# Patient Record
Sex: Female | Born: 1980 | Hispanic: No | Marital: Single
Health system: Southern US, Community
[De-identification: ages and names within clinical notes are randomized; demographics above are authoritative.]

---

## 2001-12-19 ENCOUNTER — Inpatient Hospital Stay (HOSPITAL_COMMUNITY): Admission: EM | Admit: 2001-12-19 | Discharge: 2001-12-22 | Payer: Self-pay | Admitting: Psychiatry

## 2002-02-12 ENCOUNTER — Encounter: Admission: RE | Admit: 2002-02-12 | Discharge: 2002-02-12 | Payer: Self-pay | Admitting: *Deleted

## 2005-08-29 ENCOUNTER — Emergency Department (HOSPITAL_COMMUNITY): Admission: EM | Admit: 2005-08-29 | Discharge: 2005-08-29 | Payer: Self-pay | Admitting: Emergency Medicine

## 2005-09-01 ENCOUNTER — Inpatient Hospital Stay (HOSPITAL_COMMUNITY): Admission: AD | Admit: 2005-09-01 | Discharge: 2005-09-01 | Payer: Self-pay | Admitting: Family Medicine

## 2005-10-06 ENCOUNTER — Emergency Department (HOSPITAL_COMMUNITY): Admission: EM | Admit: 2005-10-06 | Discharge: 2005-10-06 | Payer: Self-pay | Admitting: Emergency Medicine

## 2005-10-10 ENCOUNTER — Inpatient Hospital Stay (HOSPITAL_COMMUNITY): Admission: AD | Admit: 2005-10-10 | Discharge: 2005-10-10 | Payer: Self-pay | Admitting: Family Medicine

## 2005-11-03 ENCOUNTER — Inpatient Hospital Stay (HOSPITAL_COMMUNITY): Admission: AD | Admit: 2005-11-03 | Discharge: 2005-11-03 | Payer: Self-pay | Admitting: Obstetrics & Gynecology

## 2005-11-10 ENCOUNTER — Ambulatory Visit (HOSPITAL_COMMUNITY): Admission: RE | Admit: 2005-11-10 | Discharge: 2005-11-10 | Payer: Self-pay | Admitting: Family Medicine

## 2005-11-27 ENCOUNTER — Inpatient Hospital Stay (HOSPITAL_COMMUNITY): Admission: AD | Admit: 2005-11-27 | Discharge: 2005-11-27 | Payer: Self-pay | Admitting: Otolaryngology

## 2006-01-14 ENCOUNTER — Inpatient Hospital Stay (HOSPITAL_COMMUNITY): Admission: AD | Admit: 2006-01-14 | Discharge: 2006-01-14 | Payer: Self-pay | Admitting: Obstetrics and Gynecology

## 2006-04-02 ENCOUNTER — Inpatient Hospital Stay (HOSPITAL_COMMUNITY): Admission: AD | Admit: 2006-04-02 | Discharge: 2006-04-02 | Payer: Self-pay | Admitting: Obstetrics and Gynecology

## 2006-04-12 ENCOUNTER — Inpatient Hospital Stay (HOSPITAL_COMMUNITY): Admission: AD | Admit: 2006-04-12 | Discharge: 2006-04-12 | Payer: Self-pay | Admitting: Obstetrics and Gynecology

## 2006-04-13 ENCOUNTER — Inpatient Hospital Stay (HOSPITAL_COMMUNITY): Admission: AD | Admit: 2006-04-13 | Discharge: 2006-04-15 | Payer: Self-pay | Admitting: Obstetrics and Gynecology

## 2006-04-13 ENCOUNTER — Inpatient Hospital Stay (HOSPITAL_COMMUNITY): Admission: AD | Admit: 2006-04-13 | Discharge: 2006-04-13 | Payer: Self-pay | Admitting: Obstetrics and Gynecology

## 2006-05-22 ENCOUNTER — Other Ambulatory Visit: Admission: RE | Admit: 2006-05-22 | Discharge: 2006-05-22 | Payer: Self-pay | Admitting: Obstetrics and Gynecology

## 2007-07-19 IMAGING — CT CT HEAD W/O CM
1 series · 16 of 30 positions shown, 20 images · IV contrast (agent unspecified)
Comparison: None.

CLINICAL DATA: Headache. 
 HEAD CT WITHOUT CONTRAST:
TECHNIQUE: Contiguous axial images were obtained from the base of the skull through the vertex according to standard protocol without contrast.

[Series 2: head_seq 4.5 h45s st · axial · 0.43mm/px · z∈[-157,-31]mm · 16 of 32 slices shown, 20 images]
[im 2/32  brain]
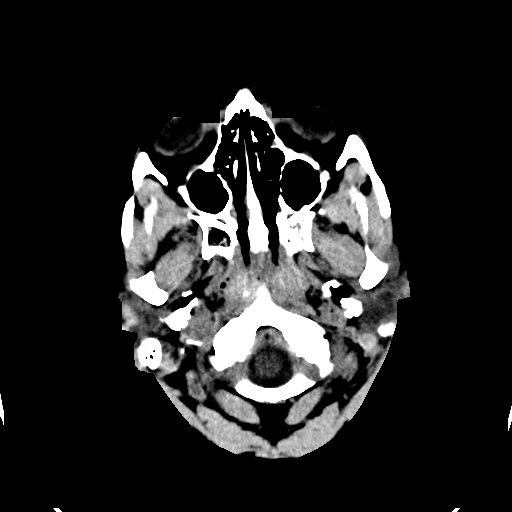
[im 2/32  bone]
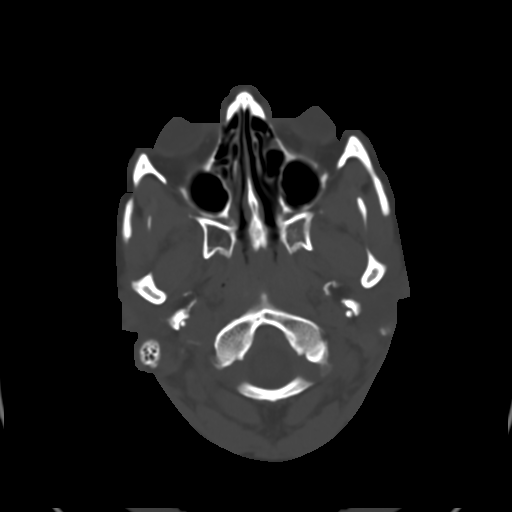
[im 4/32  brain]
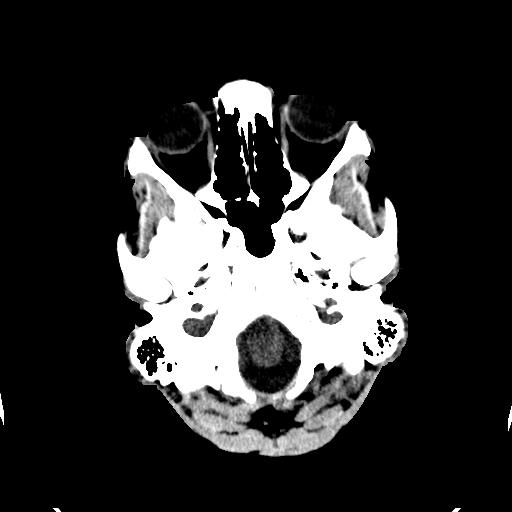
[im 6/32  brain]
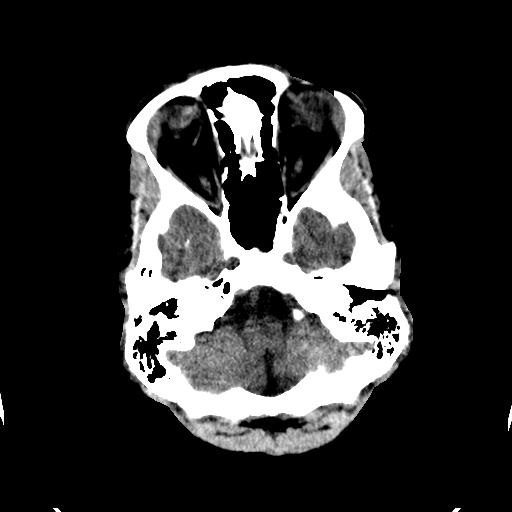
[im 8/32  brain]
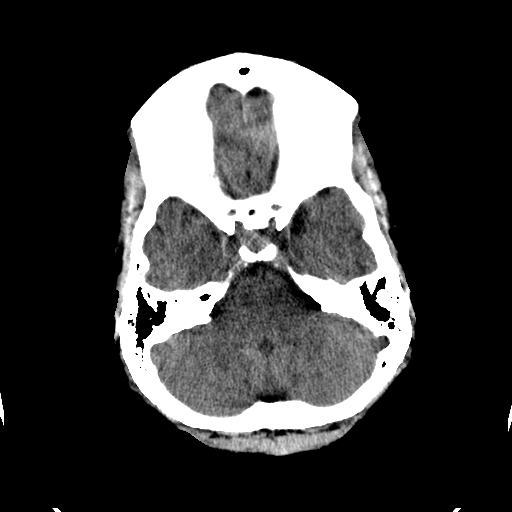
[im 9/32  brain]
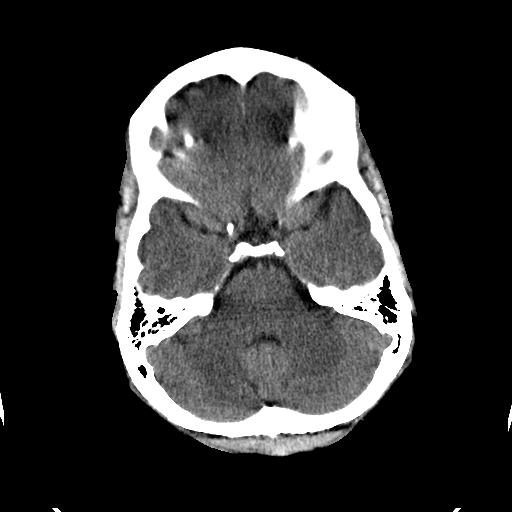
[im 9/32  bone]
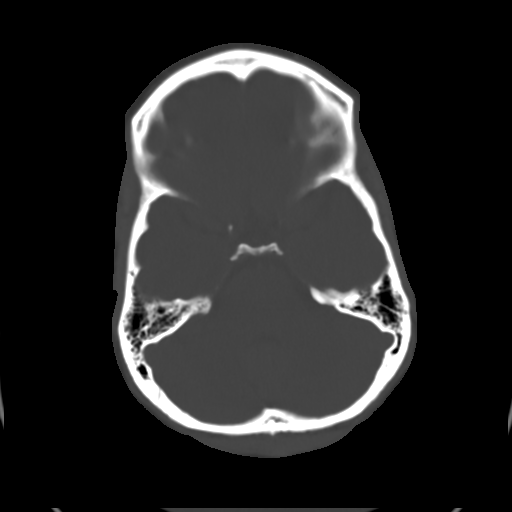
[im 11/32  brain]
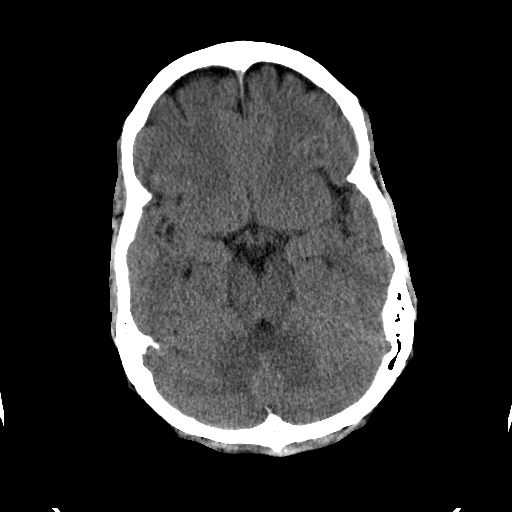
[im 13/32  brain]
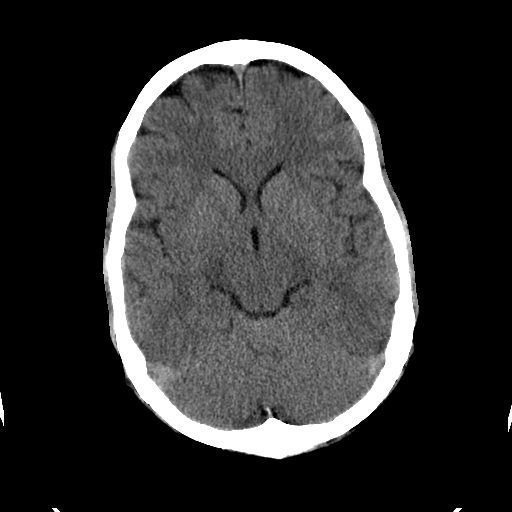
[im 15/32  brain]
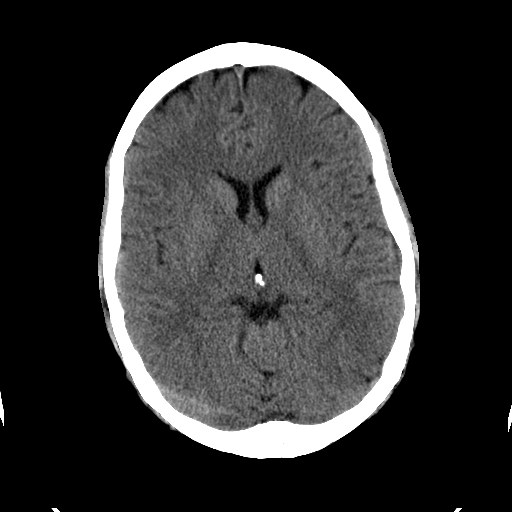
[im 17/32  brain]
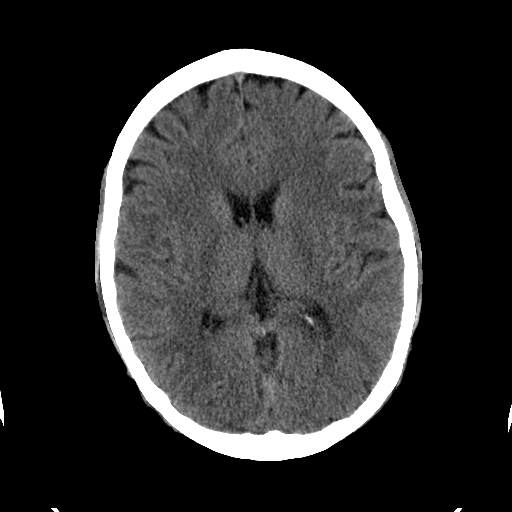
[im 17/32  bone]
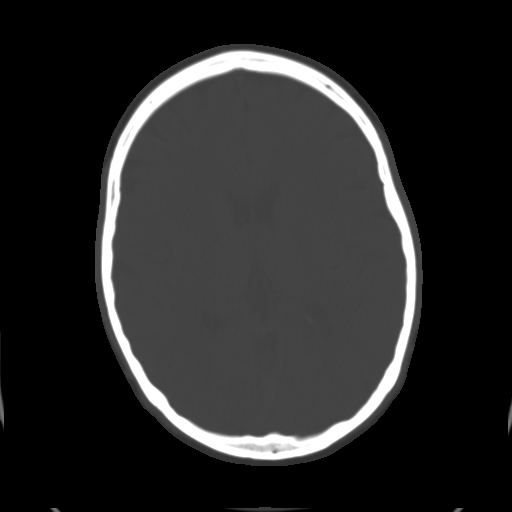
[im 19/32  brain]
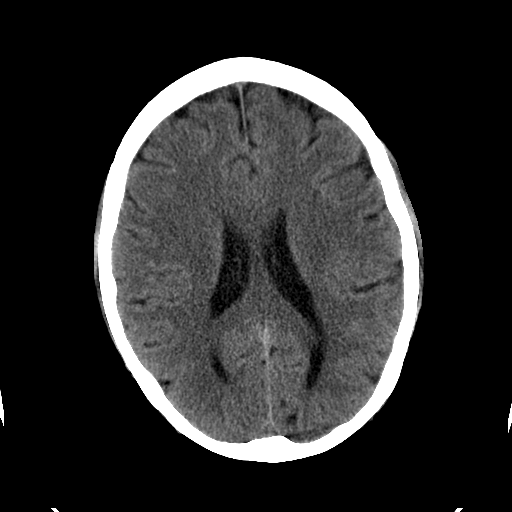
[im 21/32  brain]
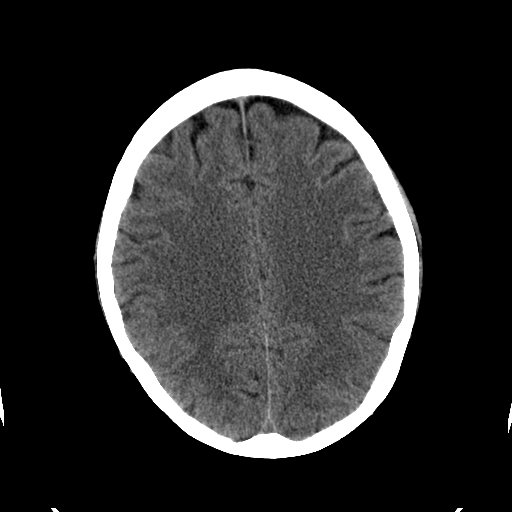
[im 23/32  brain]
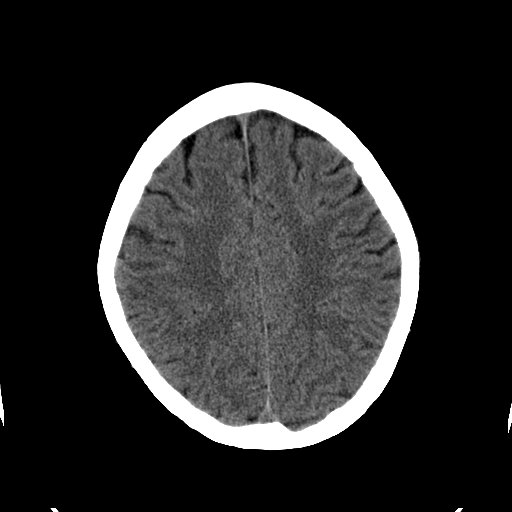
[im 24/32  brain]
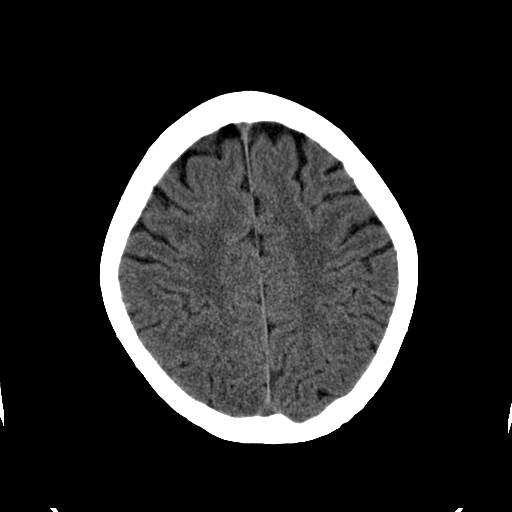
[im 24/32  bone]
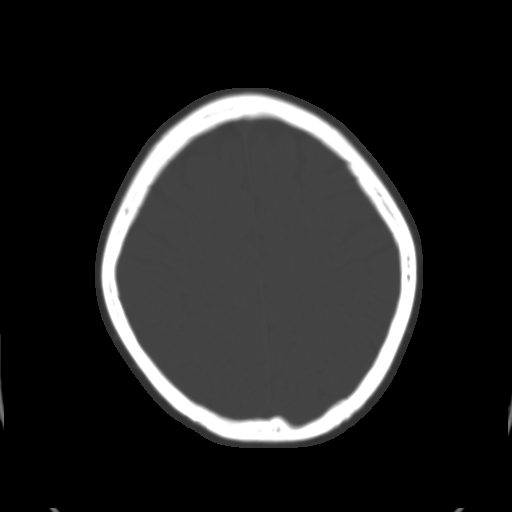
[im 26/32  brain]
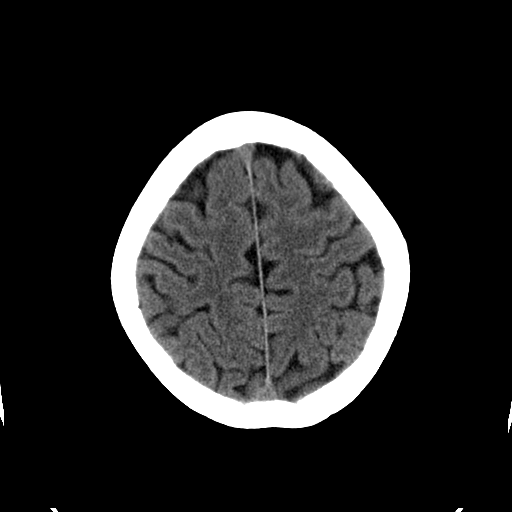
[im 28/32  brain]
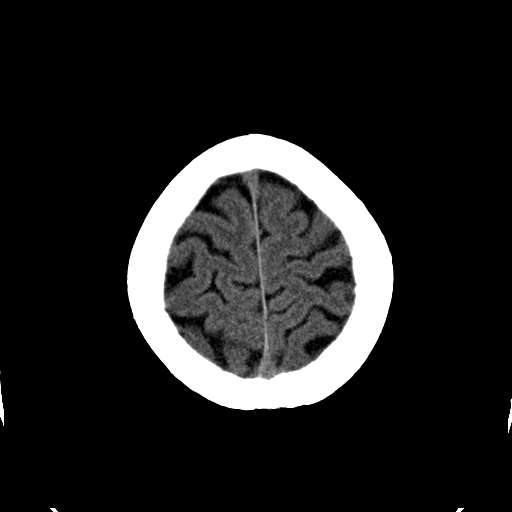
[im 30/32  brain]
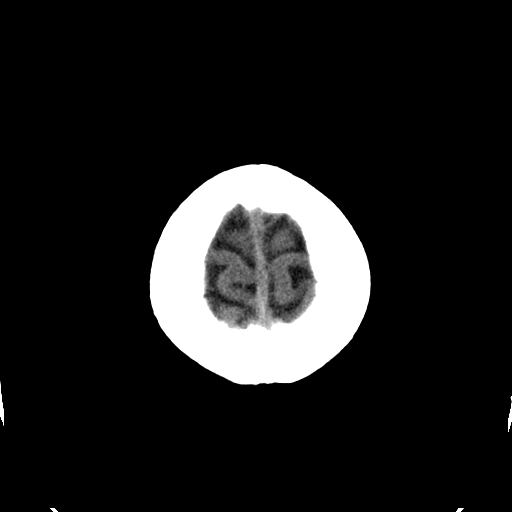

[16 of 30 positions shown; findings below may reference images not displayed]

There is no evidence of intracranial hemorrhage, brain edema, or mass effect.  No other intra-axial abnormalities are seen, and the ventricles are within normal limits.  No abnormal extra-axial fluid collections or masses are identified.  No skull abnormalities are noted.
IMPRESSION: Negative non-contrast head CT.

## 2009-06-01 ENCOUNTER — Inpatient Hospital Stay (HOSPITAL_COMMUNITY): Admission: AD | Admit: 2009-06-01 | Discharge: 2009-06-01 | Payer: Self-pay | Admitting: Obstetrics and Gynecology

## 2009-06-06 ENCOUNTER — Inpatient Hospital Stay (HOSPITAL_COMMUNITY): Admission: AD | Admit: 2009-06-06 | Discharge: 2009-06-06 | Payer: Self-pay | Admitting: Obstetrics & Gynecology

## 2009-12-27 ENCOUNTER — Inpatient Hospital Stay (HOSPITAL_COMMUNITY): Admission: AD | Admit: 2009-12-27 | Discharge: 2009-12-27 | Payer: Self-pay | Admitting: Obstetrics & Gynecology

## 2010-01-21 ENCOUNTER — Inpatient Hospital Stay (HOSPITAL_COMMUNITY): Admission: AD | Admit: 2010-01-21 | Discharge: 2010-01-21 | Payer: Self-pay | Admitting: Obstetrics and Gynecology

## 2010-01-21 ENCOUNTER — Inpatient Hospital Stay (HOSPITAL_COMMUNITY): Admission: AD | Admit: 2010-01-21 | Discharge: 2010-01-24 | Payer: Self-pay | Admitting: Obstetrics and Gynecology

## 2010-03-22 ENCOUNTER — Ambulatory Visit (HOSPITAL_COMMUNITY): Admission: RE | Admit: 2010-03-22 | Discharge: 2010-03-22 | Payer: Self-pay | Admitting: Obstetrics and Gynecology

## 2010-10-14 ENCOUNTER — Inpatient Hospital Stay (HOSPITAL_COMMUNITY): Admission: AD | Admit: 2010-10-14 | Discharge: 2009-12-31 | Payer: Self-pay | Admitting: Obstetrics and Gynecology

## 2011-01-24 LAB — CBC
HCT: 36.4 % (ref 36.0–46.0)
Hemoglobin: 12.1 g/dL (ref 12.0–15.0)
MCHC: 33.1 g/dL (ref 30.0–36.0)
MCV: 83.2 fL (ref 78.0–100.0)
Platelets: 242 10*3/uL (ref 150–400)
RBC: 4.37 MIL/uL (ref 3.87–5.11)
RDW: 16.2 % — ABNORMAL HIGH (ref 11.5–15.5)
WBC: 7.4 10*3/uL (ref 4.0–10.5)

## 2011-01-24 LAB — PREGNANCY, URINE: Preg Test, Ur: NEGATIVE

## 2011-01-26 LAB — URINALYSIS, ROUTINE W REFLEX MICROSCOPIC
Bilirubin Urine: NEGATIVE
Glucose, UA: NEGATIVE mg/dL
Hgb urine dipstick: NEGATIVE
Ketones, ur: NEGATIVE mg/dL
Nitrite: NEGATIVE
Protein, ur: NEGATIVE mg/dL
Specific Gravity, Urine: 1.01 (ref 1.005–1.030)
Urobilinogen, UA: 0.2 mg/dL (ref 0.0–1.0)
pH: 7 (ref 5.0–8.0)

## 2011-01-26 LAB — URINE MICROSCOPIC-ADD ON

## 2011-01-30 LAB — CBC
HCT: 26.3 % — ABNORMAL LOW (ref 36.0–46.0)
HCT: 31.8 % — ABNORMAL LOW (ref 36.0–46.0)
Hemoglobin: 10.3 g/dL — ABNORMAL LOW (ref 12.0–15.0)
Hemoglobin: 8.6 g/dL — ABNORMAL LOW (ref 12.0–15.0)
MCHC: 32.5 g/dL (ref 30.0–36.0)
MCHC: 32.5 g/dL (ref 30.0–36.0)
MCV: 82.8 fL (ref 78.0–100.0)
MCV: 84.8 fL (ref 78.0–100.0)
Platelets: 152 10*3/uL (ref 150–400)
Platelets: 173 10*3/uL (ref 150–400)
RBC: 3.1 MIL/uL — ABNORMAL LOW (ref 3.87–5.11)
RBC: 3.84 MIL/uL — ABNORMAL LOW (ref 3.87–5.11)
RDW: 15.1 % (ref 11.5–15.5)
RDW: 15.3 % (ref 11.5–15.5)
WBC: 11.7 10*3/uL — ABNORMAL HIGH (ref 4.0–10.5)
WBC: 12.4 10*3/uL — ABNORMAL HIGH (ref 4.0–10.5)

## 2011-01-30 LAB — RPR: RPR Ser Ql: NONREACTIVE

## 2011-02-13 LAB — URINALYSIS, ROUTINE W REFLEX MICROSCOPIC
Bilirubin Urine: NEGATIVE
Glucose, UA: NEGATIVE mg/dL
Hgb urine dipstick: NEGATIVE
Ketones, ur: >80 mg/dL — AB
Leukocytes, UA: NEGATIVE
Nitrite: NEGATIVE
Protein, ur: 30 mg/dL — AB
Specific Gravity, Urine: >1.03 — ABNORMAL HIGH (ref 1.005–1.030)
Urobilinogen, UA: 0.2 mg/dL (ref 0.0–1.0)
pH: 6 (ref 5.0–8.0)

## 2011-02-13 LAB — BASIC METABOLIC PANEL
BUN: 8 mg/dL (ref 6–23)
CO2: 24 mEq/L (ref 19–32)
Calcium: 10.2 mg/dL (ref 8.4–10.5)
Chloride: 102 mEq/L (ref 96–112)
Creatinine, Ser: 0.64 mg/dL (ref 0.4–1.2)
GFR calc Af Amer: >60 mL/min (ref >60)
GFR calc non Af Amer: >60 mL/min (ref >60)
Glucose, Bld: 87 mg/dL (ref 70–99)
Potassium: 3.9 mEq/L (ref 3.5–5.1)
Sodium: 134 mEq/L — ABNORMAL LOW (ref 135–145)

## 2011-02-13 LAB — URINE MICROSCOPIC-ADD ON

## 2011-03-25 NOTE — H&P (Signed)
[facility]  Patient:    Rebekah Nguyen, Rebekah Nguyen Visit Number: [id number] MRN: [id number]          Service Type: PSY Location: [id number] Attending Physician:  Rachael Fee Dictated by:   Candi Leash. Orsini, N.P. Admit Date:  12/19/2001                     Psychiatric Admission Assessment  DATE OF ADMISSION:  December 19, 2001  PATIENT IDENTIFICATION:  This is a 30 year old single African-American female voluntarily admitted on December 19, 2001, for depression and suicidal ideation.  HISTORY OF PRESENT ILLNESS:  The patient presents with a history of depression, feeling very overwhelmed, felt like she was getting nowhere in her therapy, having difficulty in school.  The patient was having suicidal thoughts stating "that its an option."  She reports no specific plan or intent.  Sleep and appetite have been satisfactory.  She denies any psychotic symptoms, denies any suicidal or homicidal ideation currently.  Reports decreased concentration, decreased focusing in school.  PAST PSYCHIATRIC HISTORY:  First hospitalization to [facility]. Sees B. Fosak at [facility].  No other hospitalizations.  Had a prior suicide attempt when she was about 17.  SUBSTANCE ABUSE HISTORY:  Nonsmoker.  Reports alcohol is not a problem for her.  Denies any substance abuse.  PAST MEDICAL HISTORY:  Primary care Rebekah Nguyen: The patient attends the [employer].  Medical problems: None.  MEDICATIONS:  Prozac 20 mg every day for approximately a month; finds no benefit at this time.  DRUG ALLERGIES:  No known allergies.  PHYSICAL EXAMINATION:  GENERAL:  The patient appears as a well-nourished African-American female without complaints.  LABORATORY DATA:  CMET: Within normal limits.  CBC: Within normal limits.  SOCIAL HISTORY:  A 30 year old single African-American female with no children.  She lives with her boyfriend.  She works  at a day care.  She has completed her junior year of college at [employer].  No legal problems.  FAMILY HISTORY:  Mother with depression.  MENTAL STATUS EXAMINATION:  She is an alert, young, thin African-American female, casually dressed, cooperative but guarded, good eye contact.  Speech is normal and relevant.  Mood is depressed.  Affect is flat.  Thought processes are coherent.  There is no evidence of psychosis, no auditory or visual hallucinations, no suicidal or homicidal ideations, no paranoia. Cognitive: Intact.  Memory is fair.  Concentration is decreased.  Judgment is fair.  Insight is fair.  ADMISSION DIAGNOSES: Axis I:    Major depression. Axis II:   Deferred. Axis III:  None. Axis IV:   Occupation. Axis V:    Current is 35, estimated this past year is 75.  INITIAL PLAN OF CARE:  Plan is a voluntary admission to [facility] for depression and suicidal ideation.  Contract for safety.  Check every 15 minutes.  The patient agrees to be safe.  Will resume her Prozac and increase the dosage to relieve depressive symptoms.  Will obtain labs and have the patient attend groups.  Goal is to stabilize mood and thinking so the patient can be safe, to follow up with [facility] therapist and [facility].  ESTIMATED LENGTH OF STAY:  Two to four days. Dictated by:   Candi Leash. Orsini, N.P. Attending Physician:  Rachael Fee DD:  12/21/01 TD:  12/21/01 Job: [id number]

## 2019-01-25 ENCOUNTER — Telehealth: Payer: BC Managed Care – PPO | Admitting: Family

## 2019-01-25 DIAGNOSIS — R05 Cough: Secondary | ICD-10-CM

## 2019-01-25 DIAGNOSIS — R059 Cough, unspecified: Secondary | ICD-10-CM

## 2019-01-25 MED ORDER — BENZONATATE 200 MG PO CAPS: 200.0000 mg | ORAL_CAPSULE | Freq: Three times a day (TID) | ORAL | 0 refills | Status: AC | PRN

## 2019-01-25 MED ORDER — ALBUTEROL SULFATE HFA 108 (90 BASE) MCG/ACT IN AERS: 2.0000 | INHALATION_SPRAY | RESPIRATORY_TRACT | 0 refills | Status: AC | PRN

## 2019-01-25 NOTE — Progress Notes (Signed)
Greater than 5 minutes, yet less than 10 minutes of time have been spent researching, coordinating, and implementing care for this patient today.  Thank you for the details you included in the comment boxes. Those details are very helpful in determining the best course of treatment for you and help [place] to provide the best care.  Given the history you shared, you may be right. Without testing you in person, we cannot be sure about the asthma. However, I can give you an inhaler and something for cough. I didn't see any meds on your list to cause it; reflux always comes to mind with persistent dry cough also. Either way, I will treat the cough and give you an inhaler as below.  We are sorry that you are not feeling well.  Here is how we plan to help!  Based on your presentation I believe you most likely have A cough due to allergies or asthma.  I recommend that you start the an over-the counter-allergy medication such as Claritin 10 mg or Zyrtec 10 mg daily.     In addition you may use A prescription cough medication called Tessalon Perles 100mg . You may take 1-2 capsules every 8 hours as needed for your cough.  I have also added an Albuterol inhaler, take 2 puffs every 6 hours as needed for shortness of breath.    From your responses in the eVisit questionnaire you describe inflammation in the upper respiratory tract which is causing a significant cough.  This is commonly called Bronchitis and has four common causes:    Allergies  Viral Infections  Acid Reflux  Bacterial Infection Allergies, viruses and acid reflux are treated by controlling symptoms or eliminating the cause. An example might be a cough caused by taking certain blood pressure medications. You stop the cough by changing the medication. Another example might be a cough caused by acid reflux. Controlling the reflux helps control the cough.  USE OF BRONCHODILATOR ("RESCUE") INHALERS: There is a risk from using your bronchodilator  too frequently.  The risk is that over-reliance on a medication which only relaxes the muscles surrounding the breathing tubes can reduce the effectiveness of medications prescribed to reduce swelling and congestion of the tubes themselves.  Although you feel brief relief from the bronchodilator inhaler, your asthma may actually be worsening with the tubes becoming more swollen and filled with mucus.  This can delay other crucial treatments, such as oral steroid medications. If you need to use a bronchodilator inhaler daily, several times per day, you should discuss this with your provider.  There are probably better treatments that could be used to keep your asthma under control.     HOME CARE . Only take medications as instructed by your medical team. . Complete the entire course of an antibiotic. . Drink plenty of fluids and get plenty of rest. . Avoid close contacts especially the very young and the elderly . Cover your mouth if you cough or cough into your sleeve. . Always remember to wash your hands . A steam or ultrasonic humidifier can help congestion.   GET HELP RIGHT AWAY IF: . You develop worsening fever. . You become short of breath . You cough up blood. . Your symptoms persist after you have completed your treatment plan MAKE SURE YOU   Understand these instructions.  Will watch your condition.  Will get help right away if you are not doing well or get worse.  Your e-visit answers were reviewed by a  board certified advanced clinical practitioner to complete your personal care plan.  Depending on the condition, your plan could have included both over the counter or prescription medications. If there is a problem please reply  once you have received a response from your provider. Your safety is important to [place].  If you have drug allergies check your prescription carefully.    You can use MyChart to ask questions about today's visit, request a non-urgent call back, or ask for a  work or school excuse for 24 hours related to this e-Visit. If it has been greater than 24 hours you will need to follow up with your provider, or enter a new e-Visit to address those concerns. You will get an e-mail in the next two days asking about your experience.  I hope that your e-visit has been valuable and will speed your recovery. Thank you for using e-visits.

## 2019-02-11 ENCOUNTER — Other Ambulatory Visit: Payer: Self-pay | Admitting: Allergy

## 2019-02-11 ENCOUNTER — Other Ambulatory Visit: Payer: Self-pay

## 2019-02-11 ENCOUNTER — Ambulatory Visit
Admission: RE | Admit: 2019-02-11 | Discharge: 2019-02-11 | Disposition: A | Discharge status: Home / Self Care | Payer: BC Managed Care – PPO | Source: Ambulatory Visit | Attending: Allergy | Admitting: Allergy

## 2019-02-11 DIAGNOSIS — R05 Cough: Secondary | ICD-10-CM

## 2019-02-11 DIAGNOSIS — R059 Cough, unspecified: Secondary | ICD-10-CM

## 2020-11-23 IMAGING — CR CHEST - 2 VIEW
2 series · 2 of 2 positions shown · non-contrast
Comparison: None.

CLINICAL DATA: Intermittent cough, shortness of breath and chest
tightness since August 2018.

EXAM:
CHEST - 2 VIEW

[w chest pa]
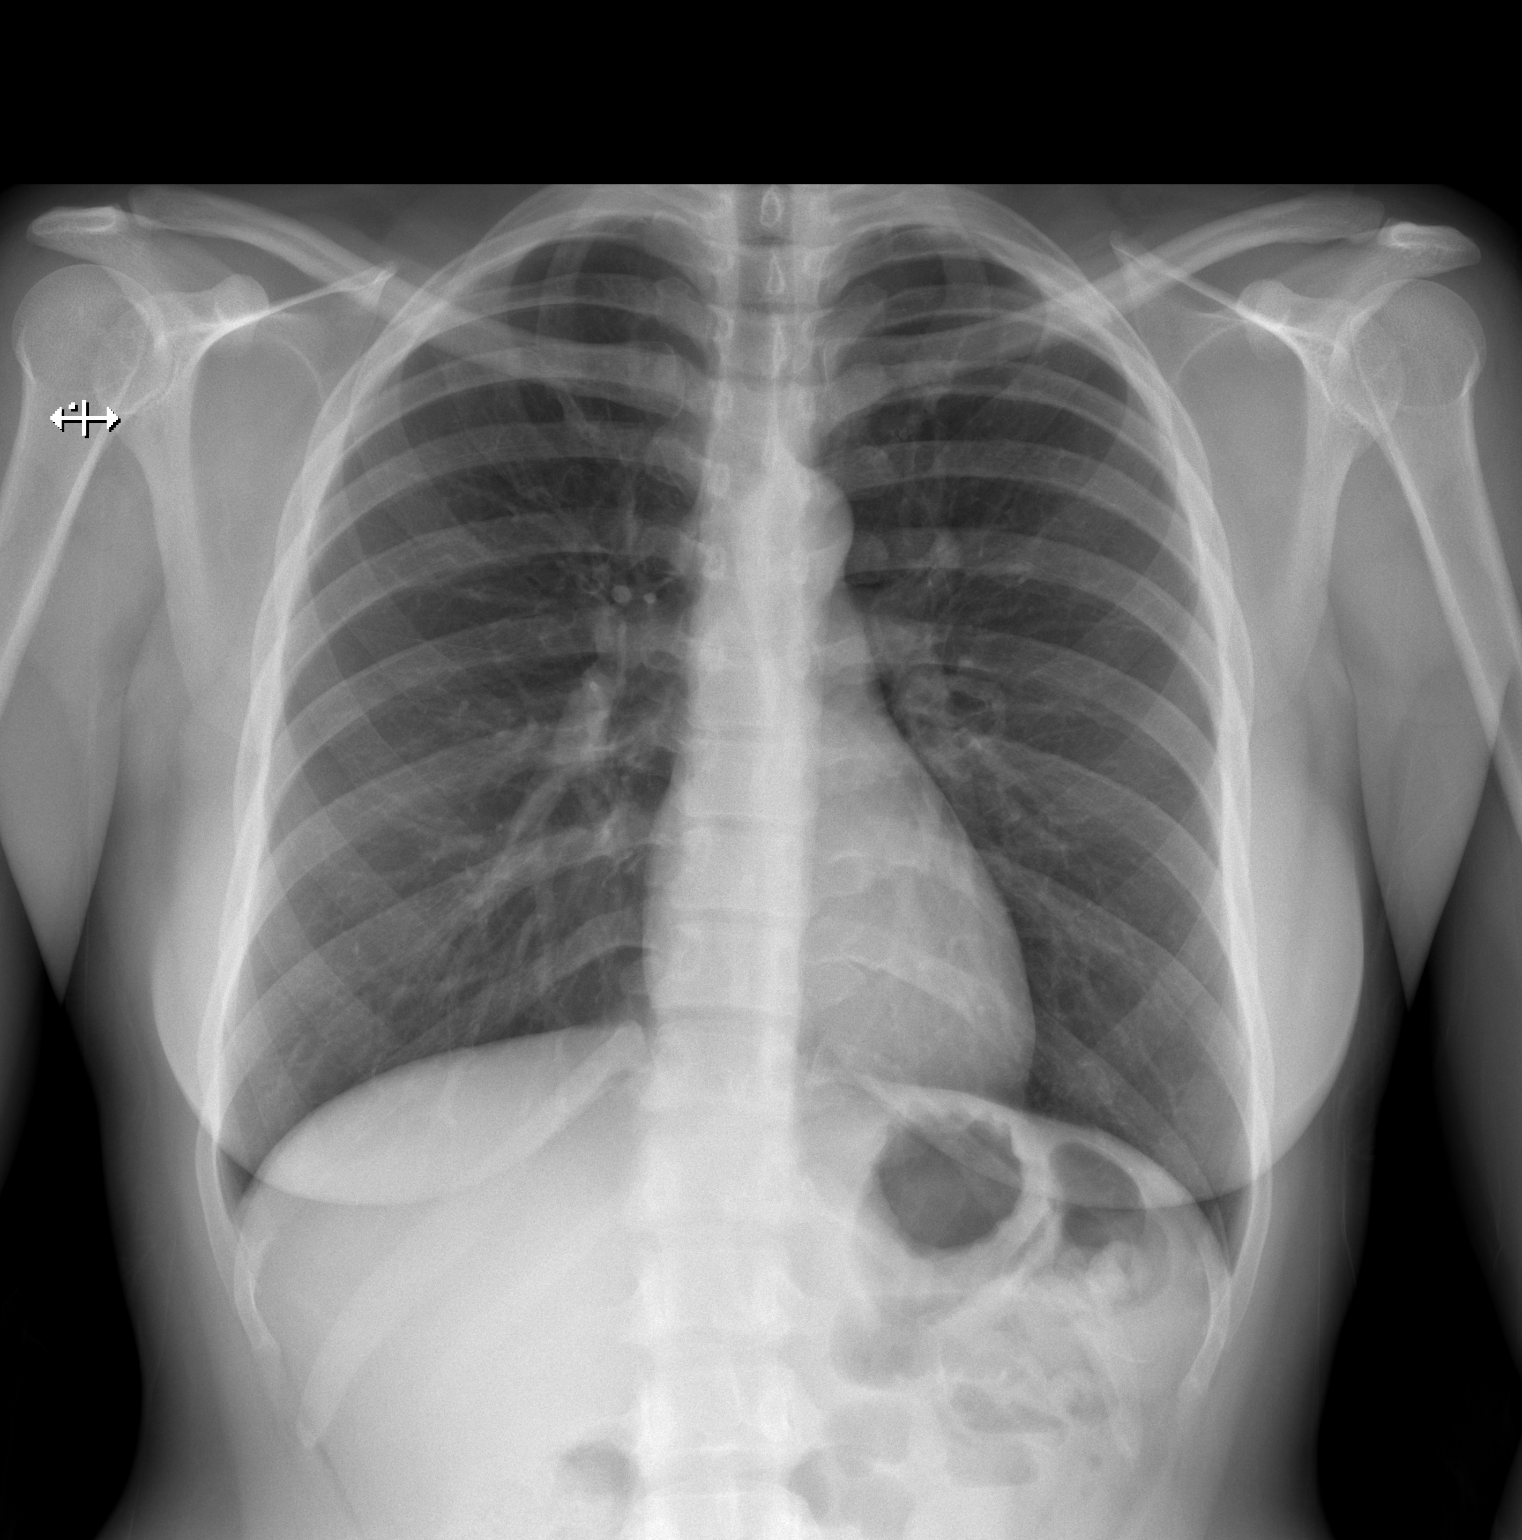

[w chest lat]
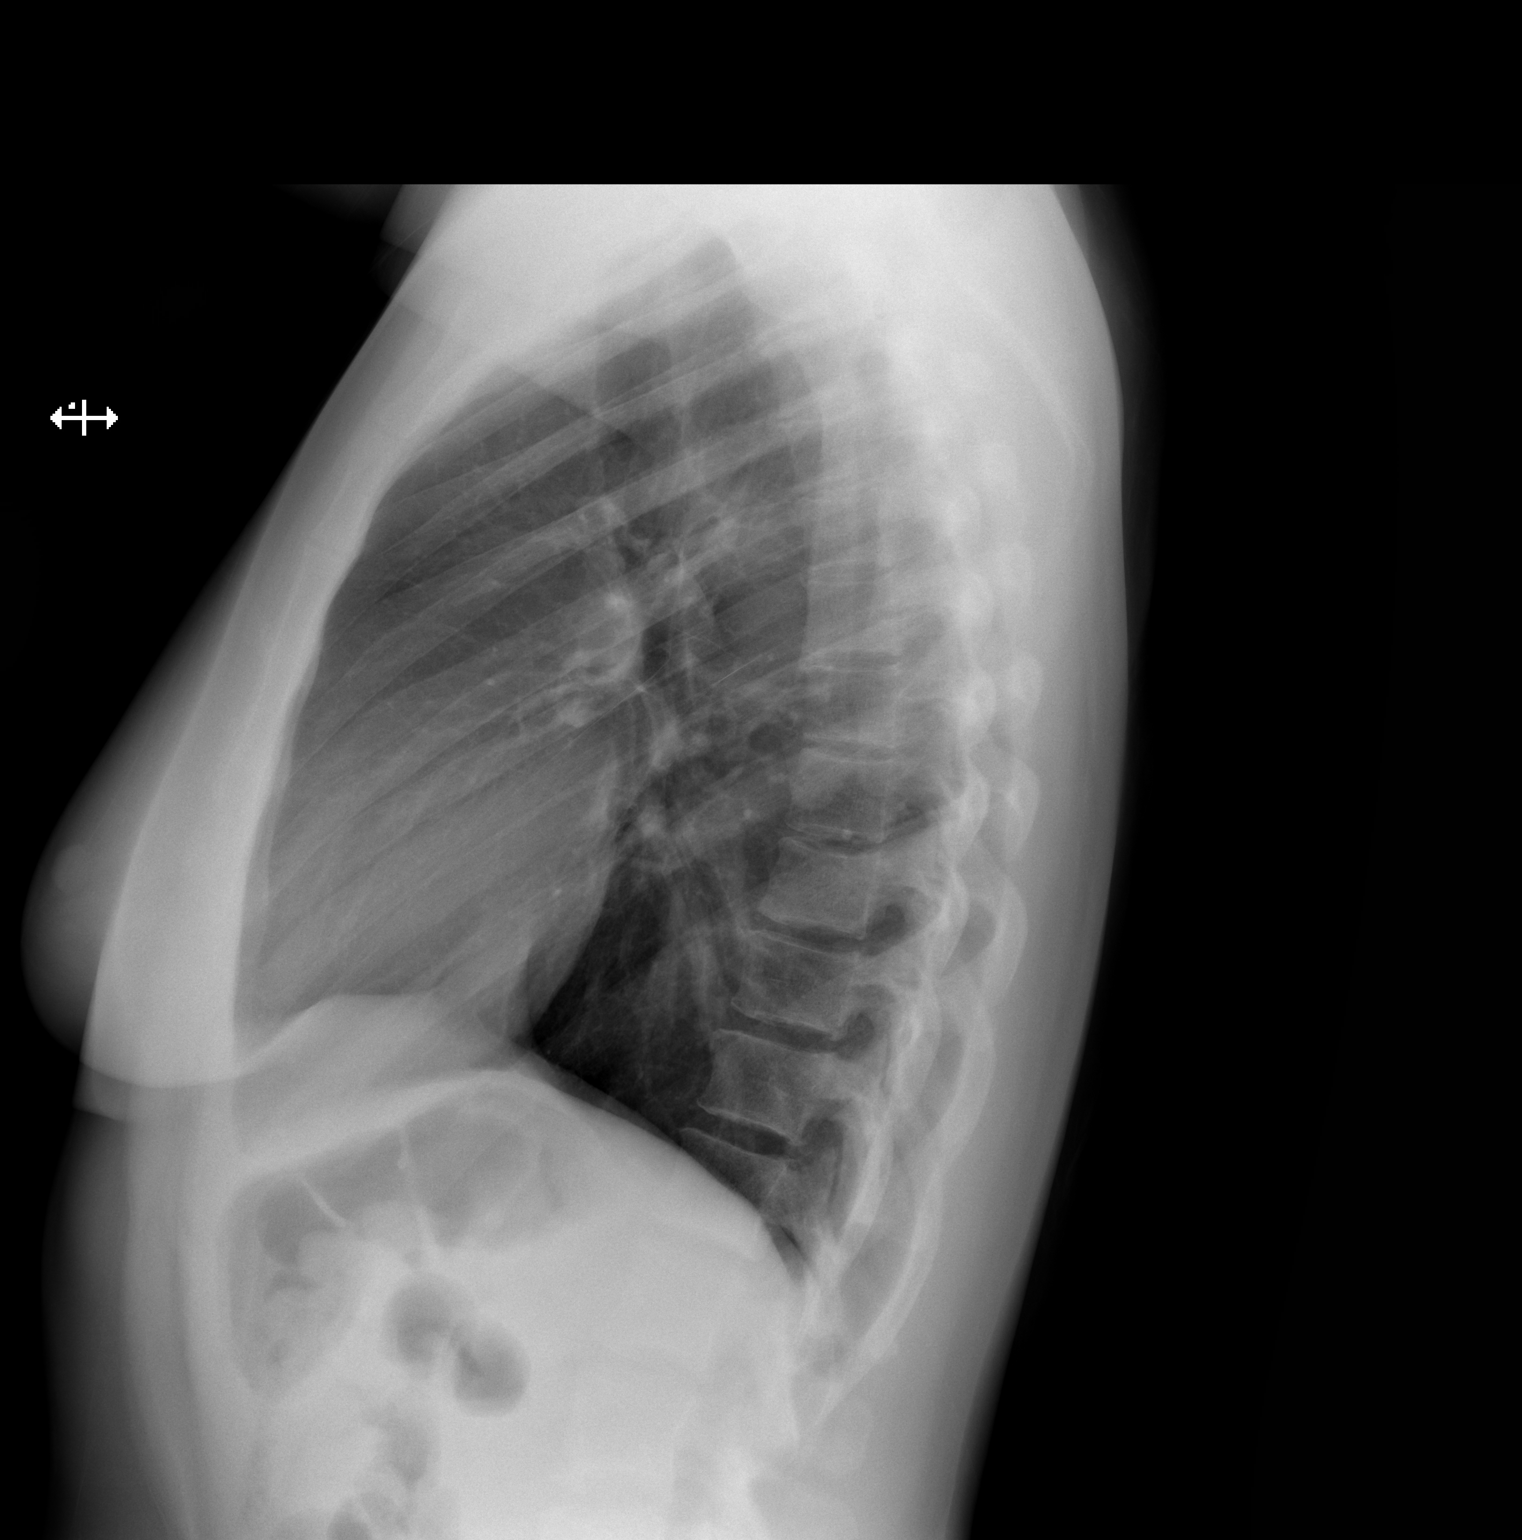

[2 of 2 positions shown; findings below may reference images not displayed]

FINDINGS: The lungs are clear. Heart size is normal. No pneumothorax or
pleural fluid. No bony abnormality.
IMPRESSION: Normal chest.

## 2022-01-25 ENCOUNTER — Ambulatory Visit: Payer: BC Managed Care – PPO | Admitting: Family Medicine

## 2022-01-25 VITALS — BP 134/86 | HR 106 | Temp 97.4°F | Ht 67.0 in | Wt 174.0 lb

## 2022-01-25 DIAGNOSIS — R634 Abnormal weight loss: Secondary | ICD-10-CM

## 2022-01-26 LAB — CBC WITH DIFFERENTIAL/PLATELET
Absolute Monocytes: 532 cells/uL (ref 200–950)
Basophils Absolute: 50 cells/uL (ref 0–200)
Basophils Relative: 0.9 %
Eosinophils Absolute: 129 cells/uL (ref 15–500)
Eosinophils Relative: 2.3 %
HCT: 28.9 % — ABNORMAL LOW (ref 35.0–45.0)
Hemoglobin: 8.3 g/dL — ABNORMAL LOW (ref 11.7–15.5)
Lymphs Abs: 2218 cells/uL (ref 850–3900)
MCH: 21.3 pg — ABNORMAL LOW (ref 27.0–33.0)
MCHC: 28.7 g/dL — ABNORMAL LOW (ref 32.0–36.0)
MCV: 74.1 fL — ABNORMAL LOW (ref 80.0–100.0)
MPV: 10.3 fL (ref 7.5–12.5)
Monocytes Relative: 9.5 %
Neutro Abs: 2671 cells/uL (ref 1500–7800)
Neutrophils Relative %: 47.7 %
Platelets: 393 10*3/uL (ref 140–400)
RBC: 3.9 10*6/uL (ref 3.80–5.10)
RDW: 19.9 % — ABNORMAL HIGH (ref 11.0–15.0)
Total Lymphocyte: 39.6 %
WBC: 5.6 10*3/uL (ref 3.8–10.8)

## 2022-01-26 LAB — LIPID PANEL
Cholesterol: 147 mg/dL (ref <200)
HDL: 52 mg/dL (ref >=50)
LDL Cholesterol (Calc): 72 mg/dL (calc)
Non-HDL Cholesterol (Calc): 95 mg/dL (calc) (ref <130)
Total CHOL/HDL Ratio: 2.8 (calc) (ref <5.0)
Triglycerides: 147 mg/dL (ref <150)

## 2022-01-26 LAB — COMPREHENSIVE METABOLIC PANEL
AG Ratio: 1.2 (calc) (ref 1.0–2.5)
ALT: 18 U/L (ref 6–29)
AST: 21 U/L (ref 10–30)
Albumin: 4.2 g/dL (ref 3.6–5.1)
Alkaline phosphatase (APISO): 50 U/L (ref 31–125)
BUN: 15 mg/dL (ref 7–25)
CO2: 24 mmol/L (ref 20–32)
Calcium: 9.8 mg/dL (ref 8.6–10.2)
Chloride: 107 mmol/L (ref 98–110)
Creat: 0.86 mg/dL (ref 0.50–0.99)
Globulin: 3.6 g/dL (calc) (ref 1.9–3.7)
Glucose, Bld: 94 mg/dL (ref 65–99)
Potassium: 4.5 mmol/L (ref 3.5–5.3)
Sodium: 140 mmol/L (ref 135–146)
Total Bilirubin: 0.2 mg/dL (ref 0.2–1.2)
Total Protein: 7.8 g/dL (ref 6.1–8.1)

## 2022-01-26 LAB — TSH+FREE T4: TSH W/REFLEX TO FT4: 1.8 mIU/L

## 2022-02-28 ENCOUNTER — Ambulatory Visit: Payer: BC Managed Care – PPO | Admitting: Family Medicine

## 2022-04-19 ENCOUNTER — Ambulatory Visit: Payer: BC Managed Care – PPO | Admitting: Family Medicine

## 2022-04-26 ENCOUNTER — Ambulatory Visit: Payer: BC Managed Care – PPO | Admitting: Family Medicine

## 2022-04-26 VITALS — BP 126/98 | HR 120 | Temp 97.5°F | Ht 67.0 in | Wt 165.0 lb

## 2022-04-26 DIAGNOSIS — R634 Abnormal weight loss: Secondary | ICD-10-CM

## 2022-04-27 LAB — COMPREHENSIVE METABOLIC PANEL
ALT: 15 IU/L (ref 0–32)
AST: 17 IU/L (ref 0–40)
Albumin/Globulin Ratio: 1.3 (ref 1.2–2.2)
Albumin: 4.7 g/dL (ref 3.8–4.8)
Alkaline Phosphatase: 57 IU/L (ref 44–121)
BUN/Creatinine Ratio: 9 (ref 9–23)
BUN: 7 mg/dL (ref 6–24)
Bilirubin Total: <0.2 mg/dL (ref 0.0–1.2)
CO2: 20 mmol/L (ref 20–29)
Calcium: 9.8 mg/dL (ref 8.7–10.2)
Chloride: 104 mmol/L (ref 96–106)
Creatinine, Ser: 0.78 mg/dL (ref 0.57–1.00)
Globulin, Total: 3.5 g/dL (ref 1.5–4.5)
Glucose: 101 mg/dL — ABNORMAL HIGH (ref 70–99)
Potassium: 4.3 mmol/L (ref 3.5–5.2)
Sodium: 139 mmol/L (ref 134–144)
Total Protein: 8.2 g/dL (ref 6.0–8.5)
eGFR: 98 mL/min/{1.73_m2} (ref >59)

## 2022-12-02 ENCOUNTER — Ambulatory Visit: Payer: BC Managed Care – PPO | Admitting: Family Medicine
